# Patient Record
Sex: Female | Born: 1975 | Race: White | Hispanic: No | Marital: Single | Smoking: Current every day smoker
Health system: Southern US, Community
[De-identification: ages and names within clinical notes are randomized; demographics above are authoritative.]

---

## 1998-05-30 ENCOUNTER — Other Ambulatory Visit: Admission: RE | Admit: 1998-05-30 | Discharge: 1998-05-30 | Payer: Self-pay | Admitting: Gynecology

## 1999-09-03 ENCOUNTER — Other Ambulatory Visit: Admission: RE | Admit: 1999-09-03 | Discharge: 1999-09-03 | Payer: Self-pay | Admitting: Gynecology

## 2001-06-01 ENCOUNTER — Other Ambulatory Visit: Admission: RE | Admit: 2001-06-01 | Discharge: 2001-06-01 | Payer: Self-pay | Admitting: Obstetrics and Gynecology

## 2001-10-23 ENCOUNTER — Encounter: Admission: RE | Admit: 2001-10-23 | Discharge: 2001-10-23 | Payer: Self-pay | Admitting: Pediatrics

## 2001-11-22 ENCOUNTER — Observation Stay (HOSPITAL_COMMUNITY): Admission: AD | Admit: 2001-11-22 | Discharge: 2001-11-22 | Payer: Self-pay | Admitting: Obstetrics and Gynecology

## 2001-11-27 ENCOUNTER — Inpatient Hospital Stay (HOSPITAL_COMMUNITY): Admission: AD | Admit: 2001-11-27 | Discharge: 2001-11-27 | Payer: Self-pay | Admitting: Obstetrics and Gynecology

## 2001-12-03 ENCOUNTER — Inpatient Hospital Stay (HOSPITAL_COMMUNITY): Admission: AD | Admit: 2001-12-03 | Discharge: 2001-12-03 | Payer: Self-pay | Admitting: *Deleted

## 2001-12-09 ENCOUNTER — Inpatient Hospital Stay (HOSPITAL_COMMUNITY): Admission: AD | Admit: 2001-12-09 | Discharge: 2001-12-13 | Payer: Self-pay | Admitting: Obstetrics and Gynecology

## 2002-01-15 ENCOUNTER — Encounter: Admission: RE | Admit: 2002-01-15 | Discharge: 2002-03-06 | Payer: Self-pay | Admitting: Pediatrics

## 2002-01-24 ENCOUNTER — Other Ambulatory Visit: Admission: RE | Admit: 2002-01-24 | Discharge: 2002-01-24 | Payer: Self-pay | Admitting: Obstetrics and Gynecology

## 2003-06-04 ENCOUNTER — Other Ambulatory Visit: Admission: RE | Admit: 2003-06-04 | Discharge: 2003-06-04 | Payer: Self-pay | Admitting: Obstetrics and Gynecology

## 2005-07-05 ENCOUNTER — Other Ambulatory Visit: Admission: RE | Admit: 2005-07-05 | Discharge: 2005-07-05 | Payer: Self-pay | Admitting: Obstetrics and Gynecology

## 2006-01-06 ENCOUNTER — Inpatient Hospital Stay (HOSPITAL_COMMUNITY): Admission: AD | Admit: 2006-01-06 | Discharge: 2006-01-06 | Payer: Self-pay | Admitting: Obstetrics and Gynecology

## 2006-01-16 ENCOUNTER — Inpatient Hospital Stay (HOSPITAL_COMMUNITY): Admission: AD | Admit: 2006-01-16 | Discharge: 2006-01-18 | Payer: Self-pay | Admitting: Obstetrics and Gynecology

## 2010-03-11 ENCOUNTER — Encounter: Admission: RE | Admit: 2010-03-11 | Discharge: 2010-03-11 | Payer: Self-pay | Admitting: Obstetrics and Gynecology

## 2010-10-09 NOTE — Discharge Summary (Signed)
NAME:  Caitlin [name], Caitlin [name]                          ACCOUNT NO.:  [id number]   MEDICAL RECORD NO.:  [id number]                   PATIENT TYPE:  INP   LOCATION:  9105                                 FACILITY:  [facility]   PHYSICIAN:  Juluis Mire, M.D.                DATE OF BIRTH:  1975/12/02   DATE OF ADMISSION:  12/09/2001  DATE OF DISCHARGE:  12/13/2001                                 DISCHARGE SUMMARY   ADMITTING DIAGNOSES:  1. Intrauterine pregnancy at 38 weeks.  2. Failure to progress.  3. Occiput posterior presentation.   DISCHARGE DIAGNOSES:  1. Status post cesarean delivery.  2. Viable female infant.   REASON FOR ADMISSION:  Please see written H&P.   HOSPITAL COURSE:  The patient was admitted to the hospital at 38 weeks in  labor.  The patient did progress to 9 cm.  The fetus was in occiput  posterior presentation.  Despite four hours with adequate labor, patient did  not progress beyond 9 cm at a 0 station.  Due to failure of progression and  persistent occiput posterior presentation, decision was made to proceed with  a primary low transverse cesarean section delivery.  The patient was taken  to the operating room where epidural anesthesia was administered to a  surgical level.  A low transverse incision was made with the delivery of a  viable female infant was delivered with Apgars of 8 at one minute and 9 at  five minutes.  Arterial cord pH was 7.29.  The patient tolerated procedure  well and was taken to the recovery room in stable condition.  On  postoperative day one patient had good return of bowel function.  Abdomen  was soft.  Abdominal dressing was noted to be clean, dry, and intact.  On  postoperative day two abdominal dressing was partially removed and incision  was noted to be clean, dry, and intact.  Abdomen was soft.  Fundus was firm.  Foley was discontinued and patient was ambulating without difficulty.  Hemoglobin was noted to be 8.1 and iron supplementation was  started.  On  postoperative day three patient complained of some pain at the right margin  of her incision.  Fundus was firm.  Abdomen was soft.  Incision was clean,  dry, and intact.  On postoperative day four pain was improving.  Abdomen was  soft.  Incision was clean, dry, and intact.  Staples were removed and  patient was discharged home.   CONDITION ON DISCHARGE:  Good.   DIET:  Regular, as tolerated.   ACTIVITY:  No heavy lifting.  No vaginal entry.  No driving x2 weeks.   FOLLOW UP:  The patient is to follow up in the office in one to two weeks  for an incision check.  She is to call for temperature greater than 100  degrees, persistent nausea and vomiting, heavy vaginal bleeding, and  redness  or drainage from the incision site.   DISCHARGE MEDICATIONS:  1. Prenatal vitamins one p.o. daily.  2. Percocet 5/325 30 one p.o. q.4-6h. p.r.n. pain.  3.     Motrin 600 mg over-the-counter q.6h. p.r.n.  4. Prozac 20 mg one p.o. daily.  5. Ferrous sulfate 325 mg one p.o. b.i.d. with meals.     Judith Blonder, N.P.                     Juluis Mire, M.D.    CGC/MEDQ  D:  01/02/2002  T:  01/03/2002  Job:  [id number]

## 2010-10-09 NOTE — Op Note (Signed)
[name], Caitlin                ACCOUNT NO.:  [id number]   MEDICAL RECORD NO.:  [id number]          PATIENT TYPE:  INP   LOCATION:  9145                          FACILITY:  [facility]   PHYSICIAN:  Guy Sandifer. Henderson Cloud, M.D. DATE OF BIRTH:  1976/02/18   DATE OF PROCEDURE:  01/16/2006  DATE OF DISCHARGE:                                 OPERATIVE REPORT   PROCEDURE:  Vacuum extraction.   INDICATIONS AND CONSENT:  This patient is a 35 year old married white female  G2, P1 with an EDC of February 07, 2006 placing her at 60 weeks estimated  gestational age.  Prenatal care has been complicated by a history of low  transverse cesarean section.  After careful consideration of the options,  the patient wants to pursue VBAC.  Ultrasound within the last week revealed  an estimated fetal weight of approximately 5 pounds 12 ounces.  The patient  presented to labor and delivery in active labor.  At 7 cm dilation, an  epidural was placed.  She then began second stage.  She had pushed for a  total of approximately an hour and 45 minutes.  There had been little  progress for the past 30 minutes or so of pushing. Fetal heart tones were  reactive.  There were variable decelerations after some of the contractions.  Some caput was noted.  With the push, the vertex was at +3.  Options were  carefully discussed with the patient and her husband including continued  pushing, a trial of trial vacuum extraction or a cesarean section.  The  patient requested vacuum extraction.  A 1 in 40,000 risk of severe morbidity  or mortality was discussed.  All questions were answered.   DESCRIPTION OF PROCEDURE:  The patient was prepped and position for  delivery.  The bladder straight catheterized.  A Kiwi vacuum extractor was  placed on the vertex.  Suction was applied only with a contraction.  Suction  was applied into the green zone on the handle.  Over the course of a single  contraction with a very easy pull, the vertex  delivered to the OA position  without difficulty.  Oronasal pharynx are suctioned.  A single of nuchal  cord is noted.  It is too tight for reduction. The baby is delivered through  it.  The baby is then delivered and good cry and tone is noted.  The cord is  clamped and cut.  A viable female infant, Apgar's of 7 and 8 are obtained.  Birth weight and arterial cord pH is pending at the time of dictation.  The  placenta is intact, three-vessels with an estimated blood loss of 500 mL.  The vagina and cervix are without laceration.  There is a second-degree  right laceration on the right labia minora periurethrally.  This is  repaired.  The patient and infant are in stable condition in the labor and  delivery room.      Guy Sandifer Henderson Cloud, M.D.  Electronically Signed     JET/MEDQ  D:  01/16/2006  T:  01/17/2006  Job:  [id number]

## 2010-10-09 NOTE — Op Note (Signed)
[facility]  Patient:    Caitlin [name], Caitlin [name] Visit Number: [id number] MRN: [id number]          Service Type: OBS Location: 910A 9105 01 Attending Physician:  Trevor Iha Dictated by:   Trevor Iha, M.D. Proc. Date: 12/09/01 Admit Date:  12/09/2001 Discharge Date: 12/13/2001                             Operative Report  PREOPERATIVE DIAGNOSES:       1. Intrauterine pregnancy at 38 weeks.                               2. Failure to progress.                               3. Occiput posterior presentation.  POSTOPERATIVE DIAGNOSES:      1. Intrauterine pregnancy at 38 weeks.                               2. Failure to progress.                               3. Occiput posterior presentation.  PROCEDURE:                    Primary low segment transverse cesarean section.  SURGEON:                      Trevor Iha, M.D.  ANESTHESIA:                   Epidural.  INDICATIONS:                  The patient is a 35 year old G1, P0 who presented at 38 weeks in labor.  She progressed to 9 cm.  The fetus was in the occiput posterior presentation.  Despite four hours of adequate labor, she did not progress beyond 9 cm and 0 station.  Because of failure to progress and persistent occiput posterior presentation, we proceed with primary low segment transverse cesarean section.  The risks and benefits were discussed and informed consent was obtained.  FINDINGS AT THE TIME OF SURGERY:              Viable female infant.  Apgars were 8 and 9. Arterial pH was 7.29.  DESCRIPTION OF PROCEDURE:     After adequate analgesia, the patient was placed in the supine position with left lateral tilt.  She was sterilely prepped and draped.  The bladder was sterilely drained with a Foley catheter.  A Pfannenstiel skin incision was made two fingerbreadths above the pubic symphysis.  The incision was taken down sharply to the fascia.  The fascia was incised  transversely and extended superiorly and inferiorly off the belly of the rectus muscles, which were separated sharply in the midline.  The peritoneum was entered sharply.  A bladder flap was created and placed behind a bladder blade.  A low segment myotomy incision was made down to the infants vertex.  This was extended laterally with the operators finger tips.  The infants vertex was delivered atraumatically.  The nares and nasopharynx were  suctioned.  The infant was then delivered.  The cord was clamped and cut and the infant was handed to the pediatricians for resuscitation.  Cold blood was then obtained.  The placenta was extracted manually.  The uterus was exteriorized and wiped clean with a dry lap.  The myotomy incision was closed in two layers, the first being a running locking layer with 0 Monocryl, the second being an imbricating layer of 0 Monocryl.  Normal uterus, tubes and ovaries were noted.  The uterus was placed back in the peritoneal cavity. After a copious amount of irrigation and adequate hemostasis was assured, the peritoneum was closed.  The rectus muscle was plicated in the midline with 0 Monocryl suture.  Irrigation was applied.  After adequate hemostasis, the fascia was closed with #1 Vicryl in a running fashion.  Irrigation was applied.  After adequate hemostasis, Campers fascia was approximated with 0 plain suture in interrupted fashion.  Staples were then applied.  The patient tolerated the procedure well.  Sponge, needle and instrument counts were normal x3.  The patient received 1 g of cefotetan after delivery of the placenta. Dictated by:   Trevor Iha, M.D. Attending Physician:  Trevor Iha DD:  12/09/01 TD:  12/13/01 Job: [id number]

## 2012-04-08 IMAGING — MG MM DIAGNOSTIC UNILATERAL L
2 series · 2 of 2 positions shown · non-contrast
Comparison: None.

CLINICAL DATA: The patient returns for evaluation of a possible
mass in the left breast noted on recent screening study from
[REDACTED] dated 02/24/2010.

DIGITAL DIAGNOSTIC LEFT MAMMOGRAM  AND LEFT BREAST ULTRASOUND:

[L CC]
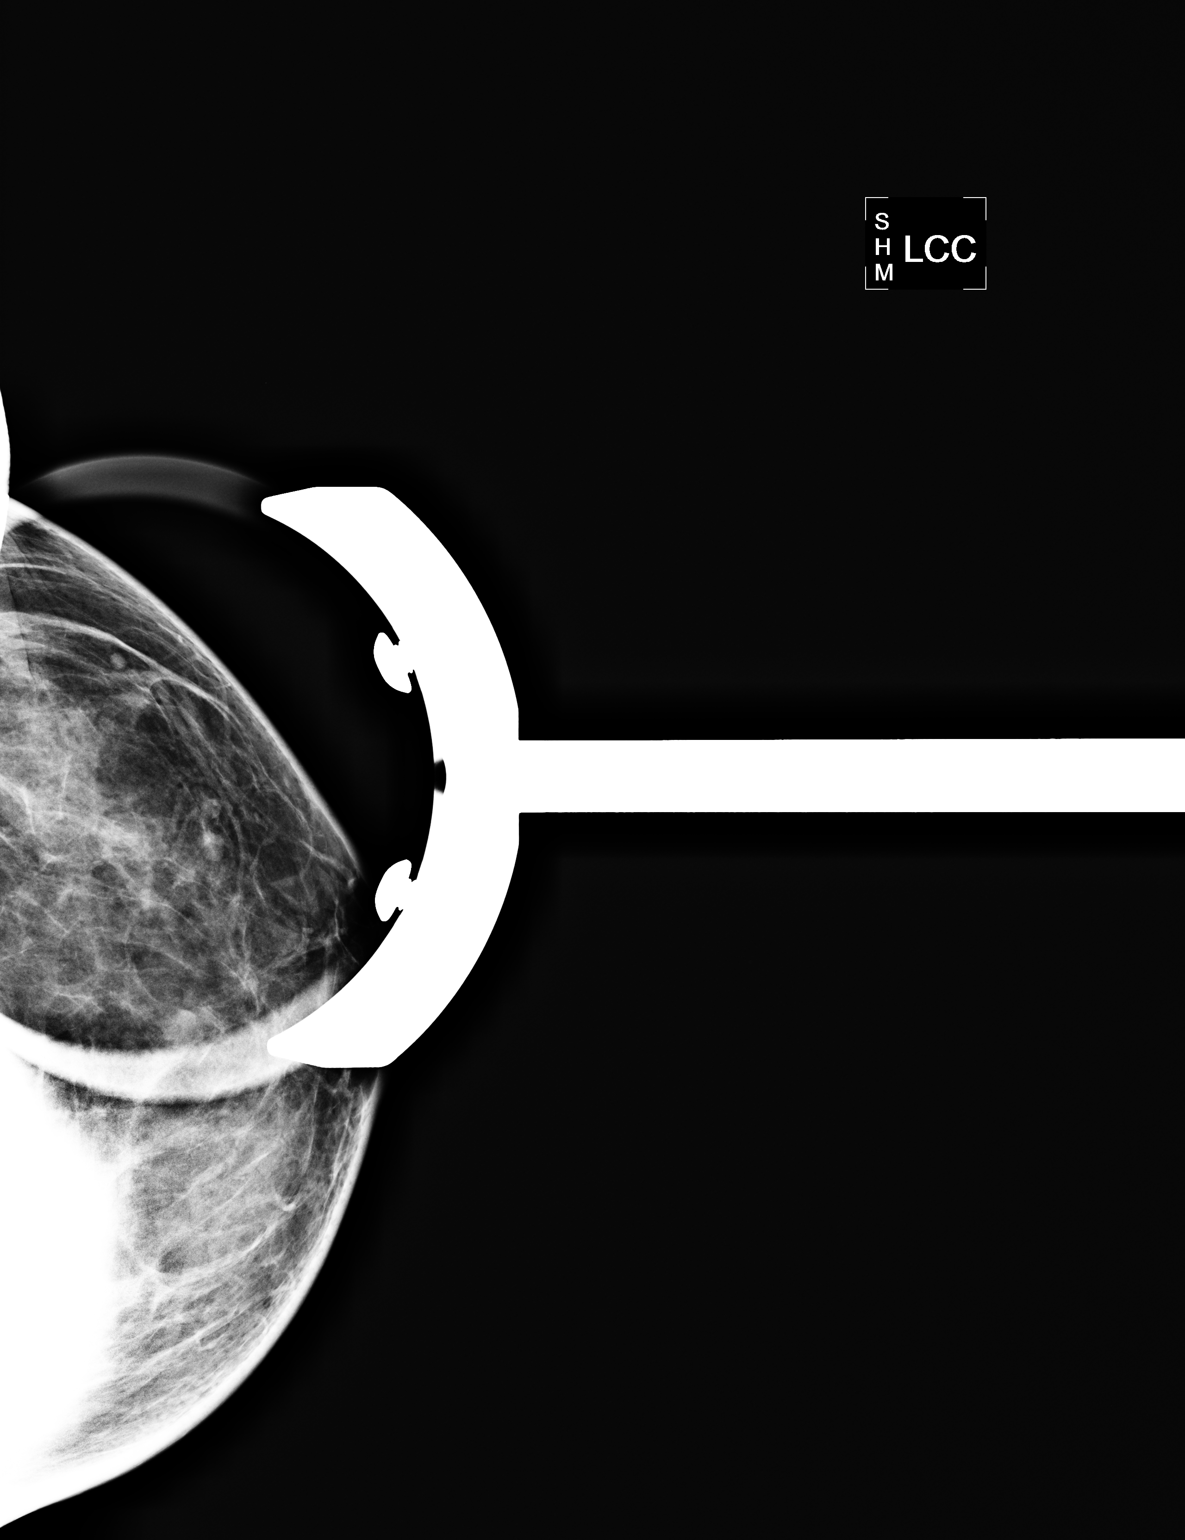

[L MLO]
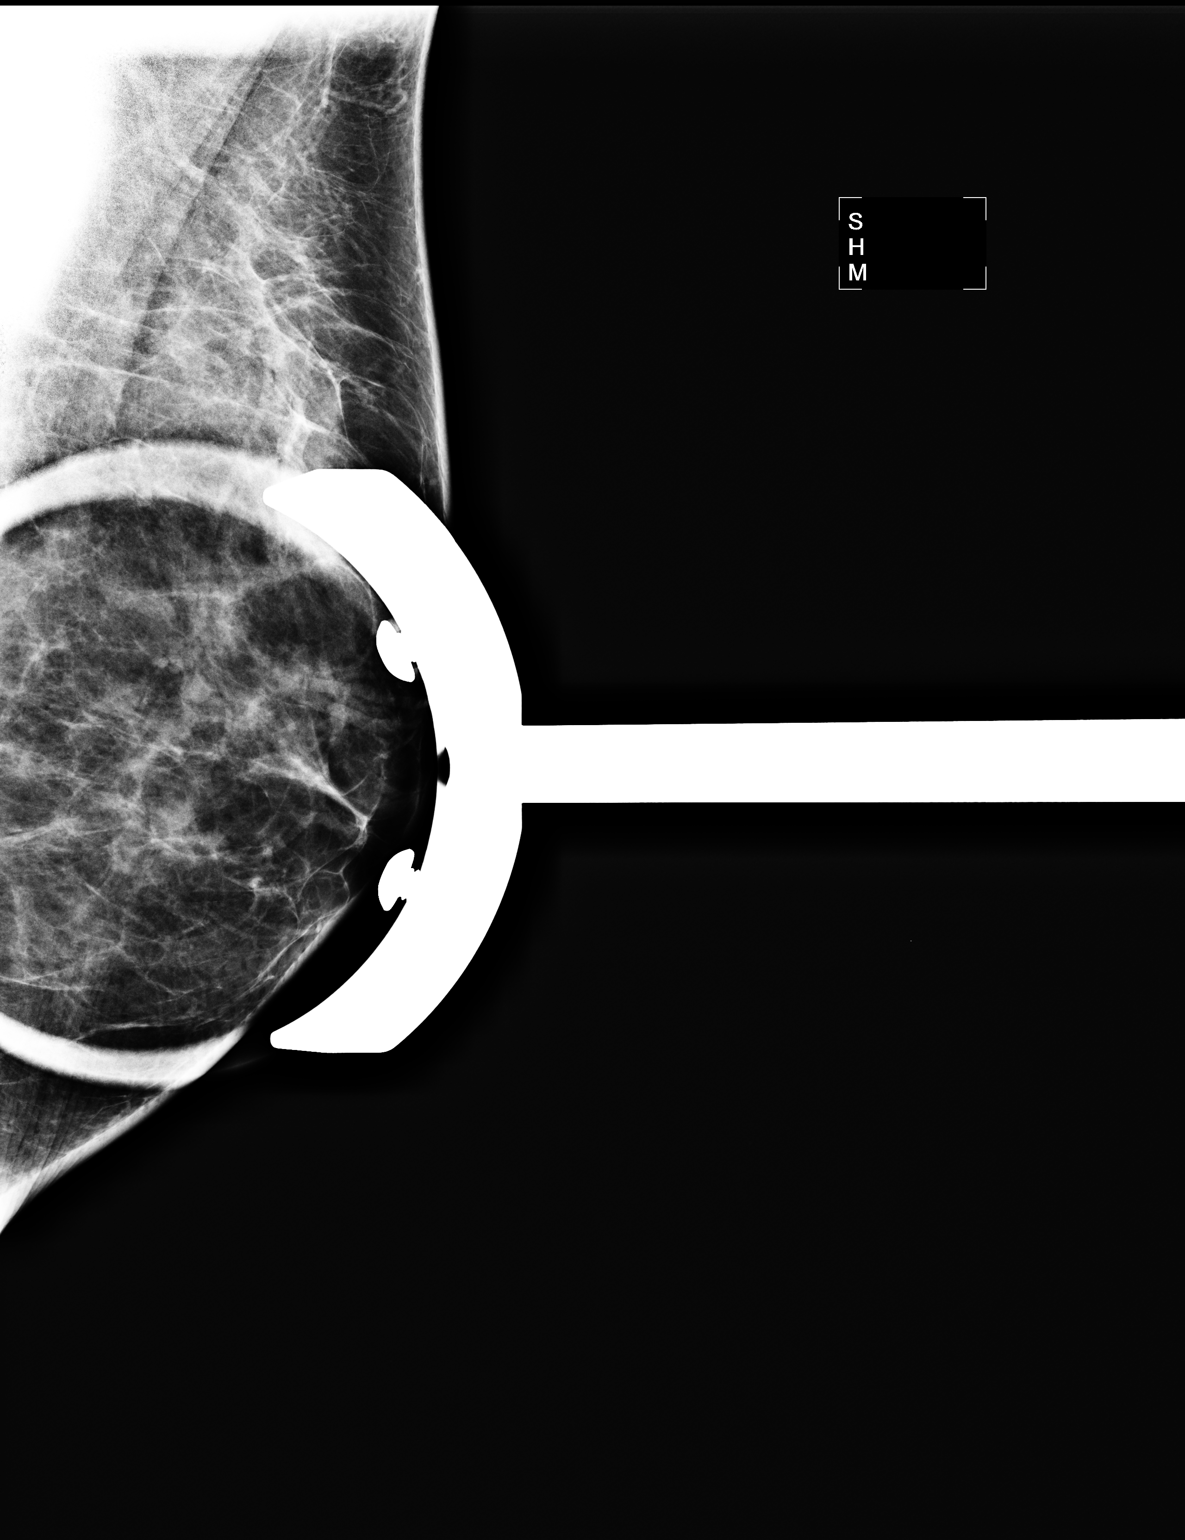

[2 of 2 positions shown; findings below may reference images not displayed]

FINDINGS: Additional views confirm the presence of an oval nodule
in the outer portion of the left breast.

On physical exam, no mass is palpated in the outer portion of the
left breast.

Ultrasound is performed, showing an intramammary lymph node at 3
o'clock, 3 cm from the left nipple.
IMPRESSION: No mammographic or sonographic evidence of malignancy.  Yearly
screening mammography should begin at age 40 unless clinically
indicated earlier.

BI-RADS CATEGORY 2:  Benign finding(s).

## 2013-12-19 ENCOUNTER — Emergency Department (HOSPITAL_BASED_OUTPATIENT_CLINIC_OR_DEPARTMENT_OTHER)
Admission: EM | Admit: 2013-12-19 | Discharge: 2013-12-19 | Disposition: A | Discharge status: Home / Self Care | Payer: Managed Care, Other (non HMO) | Attending: Emergency Medicine | Admitting: Emergency Medicine

## 2013-12-19 ENCOUNTER — Encounter (HOSPITAL_BASED_OUTPATIENT_CLINIC_OR_DEPARTMENT_OTHER): Payer: Self-pay | Admitting: Emergency Medicine

## 2013-12-19 DIAGNOSIS — R109 Unspecified abdominal pain: Secondary | ICD-10-CM | POA: Insufficient documentation

## 2013-12-19 DIAGNOSIS — Z792 Long term (current) use of antibiotics: Secondary | ICD-10-CM | POA: Insufficient documentation

## 2013-12-19 DIAGNOSIS — A09 Infectious gastroenteritis and colitis, unspecified: Secondary | ICD-10-CM | POA: Insufficient documentation

## 2013-12-19 DIAGNOSIS — Z79899 Other long term (current) drug therapy: Secondary | ICD-10-CM | POA: Insufficient documentation

## 2013-12-19 DIAGNOSIS — R112 Nausea with vomiting, unspecified: Secondary | ICD-10-CM | POA: Insufficient documentation

## 2013-12-19 LAB — CBC WITH DIFFERENTIAL/PLATELET
BASOS PCT: 0 % (ref 0–1)
Basophils Absolute: 0 10*3/uL (ref 0.0–0.1)
Eosinophils Absolute: 0.1 10*3/uL (ref 0.0–0.7)
Eosinophils Relative: 1 % (ref 0–5)
HCT: 40.4 % (ref 36.0–46.0)
HEMOGLOBIN: 14.7 g/dL (ref 12.0–15.0)
Lymphocytes Relative: 24 % (ref 12–46)
Lymphs Abs: 1.5 10*3/uL (ref 0.7–4.0)
MCH: 31.8 pg (ref 26.0–34.0)
MCHC: 36.4 g/dL — ABNORMAL HIGH (ref 30.0–36.0)
MCV: 87.4 fL (ref 78.0–100.0)
Monocytes Absolute: 1.2 10*3/uL — ABNORMAL HIGH (ref 0.1–1.0)
Monocytes Relative: 18 % — ABNORMAL HIGH (ref 3–12)
Neutro Abs: 3.6 10*3/uL (ref 1.7–7.7)
Neutrophils Relative %: 57 % (ref 43–77)
Platelets: 344 10*3/uL (ref 150–400)
RBC: 4.62 MIL/uL (ref 3.87–5.11)
RDW: 12.7 % (ref 11.5–15.5)
WBC: 6.4 10*3/uL (ref 4.0–10.5)

## 2013-12-19 LAB — BASIC METABOLIC PANEL
Anion gap: 13 (ref 5–15)
BUN: 9 mg/dL (ref 6–23)
CHLORIDE: 98 meq/L (ref 96–112)
CO2: 23 mEq/L (ref 19–32)
Calcium: 9.7 mg/dL (ref 8.4–10.5)
Creatinine, Ser: 0.8 mg/dL (ref 0.50–1.10)
GFR calc non Af Amer: >90 mL/min (ref >90)
Glucose, Bld: 110 mg/dL — ABNORMAL HIGH (ref 70–99)
Potassium: 4 mEq/L (ref 3.7–5.3)
SODIUM: 134 meq/L — AB (ref 137–147)

## 2013-12-19 MED ORDER — CIPROFLOXACIN HCL 500 MG PO TABS: 500.0000 mg | ORAL_TABLET | Freq: Two times a day (BID) | ORAL | Status: AC

## 2013-12-19 MED ORDER — METRONIDAZOLE 500 MG PO TABS: 500.0000 mg | ORAL_TABLET | Freq: Two times a day (BID) | ORAL | Status: AC

## 2013-12-19 MED ORDER — ONDANSETRON 4 MG PO TBDP: 4.0000 mg | ORAL_TABLET | Freq: Three times a day (TID) | ORAL | Status: AC | PRN

## 2013-12-19 MED ORDER — METRONIDAZOLE 500 MG PO TABS
500.0000 mg | ORAL_TABLET | Freq: Once | ORAL | Status: AC
  Administered 2013-12-19: 500 mg via ORAL
  Filled 2013-12-19: qty 1

## 2013-12-19 MED ORDER — MORPHINE SULFATE 4 MG/ML IJ SOLN
4.0000 mg | INTRAMUSCULAR | Status: DC | PRN
  Administered 2013-12-19: 4 mg via INTRAVENOUS
  Filled 2013-12-19: qty 1

## 2013-12-19 MED ORDER — SODIUM CHLORIDE 0.9 % IV BOLUS (SEPSIS)
1000.0000 mL | Freq: Once | INTRAVENOUS | Status: AC
  Administered 2013-12-19: 1000 mL via INTRAVENOUS

## 2013-12-19 MED ORDER — SODIUM CHLORIDE 0.9 % IV SOLN: Freq: Once | INTRAVENOUS | Status: DC

## 2013-12-19 MED ORDER — HYDROCODONE-ACETAMINOPHEN 5-325 MG PO TABS: 2.0000 | ORAL_TABLET | ORAL | Status: AC | PRN

## 2013-12-19 MED ORDER — CIPROFLOXACIN HCL 500 MG PO TABS
500.0000 mg | ORAL_TABLET | Freq: Once | ORAL | Status: AC
  Administered 2013-12-19: 500 mg via ORAL
  Filled 2013-12-19: qty 1

## 2013-12-19 MED ORDER — HYDROCODONE-ACETAMINOPHEN 5-325 MG PO TABS: 1.0000 | ORAL_TABLET | ORAL | Status: AC | PRN

## 2013-12-19 MED ORDER — ONDANSETRON HCL 4 MG PO TABS: 4.0000 mg | ORAL_TABLET | Freq: Four times a day (QID) | ORAL | Status: AC

## 2013-12-19 MED ORDER — ONDANSETRON HCL 4 MG/2ML IJ SOLN
4.0000 mg | Freq: Once | INTRAMUSCULAR | Status: AC
  Administered 2013-12-19: 4 mg via INTRAVENOUS
  Filled 2013-12-19: qty 2

## 2013-12-19 NOTE — ED Notes (Signed)
States that she is having abd cramping, N/V stopped Sunday but diarrhea continues. Went to PCP yesterday and was told to come to the ER for a CT scan.

## 2013-12-19 NOTE — Discharge Instructions (Signed)

## 2013-12-19 NOTE — ED Provider Notes (Signed)
CSN: [id number]     Arrival date & time 12/19/13  1254 History   First MD Initiated Contact with Patient 12/19/13 1348     Chief Complaint  Patient presents with  . GI Problem     HPI  Patient presents with 340s the symptoms restarted on Saturday feeling "like the flu". She has bodyaches fevers chills abdominal cramping. Nausea and vomiting for the last 2 days. Runny stool yesterday. The bloody stool today. Liquid stool with mucus and blood. No localizing abdominal tenderness but mild diffuse cramping.  History reviewed. No pertinent past medical history. History reviewed. No pertinent past surgical history. No family history on file. History  Substance Use Topics  . Smoking status: Current Every Day Smoker  . Smokeless tobacco: Not on file  . Alcohol Use: Yes     Comment: ocassional   OB History   Grav Para Term Preterm Abortions TAB SAB Ect Mult Living                 Review of Systems  Constitutional: Negative for fever, chills, diaphoresis, appetite change and fatigue.  HENT: Negative for mouth sores, sore throat and trouble swallowing.   Eyes: Negative for visual disturbance.  Respiratory: Negative for cough, chest tightness, shortness of breath and wheezing.   Cardiovascular: Negative for chest pain.  Gastrointestinal: Positive for nausea, vomiting, abdominal pain, diarrhea and blood in stool. Negative for abdominal distention.  Endocrine: Negative for polydipsia, polyphagia and polyuria.  Genitourinary: Negative for dysuria, frequency and hematuria.  Musculoskeletal: Negative for gait problem.  Skin: Negative for color change, pallor and rash.  Neurological: Negative for dizziness, syncope, light-headedness and headaches.  Hematological: Does not bruise/bleed easily.  Psychiatric/Behavioral: Negative for behavioral problems and confusion.      Allergies  Review of patient's allergies indicates no known allergies.  Home Medications   Prior to Admission  medications   Medication Sig Start Date End Date Taking? Authorizing Provider  amphetamine-dextroamphetamine (ADDERALL) 30 MG tablet Take 30 mg by mouth daily.   Yes Historical Provider, MD  ARIPiprazole (ABILIFY) 9.75 MG/1.3ML injection Inject into the muscle once.   Yes Historical Provider, MD  FLUoxetine (PROZAC) 10 MG capsule Take 10 mg by mouth daily.   Yes Historical Provider, MD  lamoTRIgine (LAMICTAL) 100 MG tablet Take 100 mg by mouth daily.   Yes Historical Provider, MD  thyroid (ARMOUR) 120 MG tablet Take 120 mg by mouth daily before breakfast.   Yes Historical Provider, MD  ciprofloxacin (CIPRO) 500 MG tablet Take 1 tablet (500 mg total) by mouth every 12 (twelve) hours. 12/19/13   Rolland Porter, MD  HYDROcodone-acetaminophen (NORCO/VICODIN) 5-325 MG per tablet Take 1 tablet by mouth every 4 (four) hours as needed. 12/19/13   Rolland Porter, MD  metroNIDAZOLE (FLAGYL) 500 MG tablet Take 1 tablet (500 mg total) by mouth 2 (two) times daily. 12/19/13   Rolland Porter, MD  ondansetron (ZOFRAN) 4 MG tablet Take 1 tablet (4 mg total) by mouth every 6 (six) hours. 12/19/13   Rolland Porter, MD   BP 110/56  Pulse 92  Temp(Src) 97.7 F (36.5 C) (Oral)  Resp 18  Ht 5\' 3"  (1.6 m)  Wt 165 lb (74.844 kg)  BMI 29.24 kg/m2  SpO2 100% Physical Exam  Constitutional: She is oriented to person, place, and time. She appears well-developed and well-nourished. No distress.  HENT:  Head: Normocephalic.  Eyes: Conjunctivae are normal. Pupils are equal, round, and reactive to light. No scleral icterus.  Neck: Normal  range of motion. Neck supple. No thyromegaly present.  Cardiovascular: Normal rate and regular rhythm.  Exam reveals no gallop and no friction rub.   No murmur heard. Pulmonary/Chest: Effort normal and breath sounds normal. No respiratory distress. She has no wheezes. She has no rales.  Abdominal: Soft. Bowel sounds are normal. She exhibits no distension. There is no tenderness. There is no rebound.   Abdomen soft. Diffuse tenderness. No peritoneal irritation or localizing pain or tenderness.  Musculoskeletal: Normal range of motion.  Neurological: She is alert and oriented to person, place, and time.  Skin: Skin is warm and dry. No rash noted.  Psychiatric: She has a normal mood and affect. Her behavior is normal.    ED Course  Procedures (including critical care time) Labs Review Labs Reviewed  BASIC METABOLIC PANEL - Abnormal; Notable for the following:    Sodium 134 (*)    Glucose, Bld 110 (*)    All other components within normal limits  CBC WITH DIFFERENTIAL - Abnormal; Notable for the following:    MCHC 36.4 (*)    Monocytes Relative 18 (*)    Monocytes Absolute 1.2 (*)    All other components within normal limits    Imaging Review No results found.   EKG Interpretation None      MDM   Final diagnoses:  Infectious colitis    Exam shows diffuse tenderness without localized tenderness. Distally no peritoneal irritation. CT not indicated. No concern that this is ischemic. Not tender locally to suggest diverticulitis or abscess or perforation. I think is very likely a simple infectious colitis with viral symptoms now with bloody stools. Plan will be appear treatment. Zofran Cipro Vicodin agile. Primary care followup if not improving.   Rolland PorterMark Rufino Staup, MD 12/19/13 1504
